# Patient Record
Sex: Male | Born: 1965 | Race: White | Hispanic: Yes | Marital: Married | Smoking: Never smoker
Health system: Southern US, Community
[De-identification: ages and names within clinical notes are randomized; demographics above are authoritative.]

## PROBLEM LIST (undated history)

## (undated) DIAGNOSIS — K219 Gastro-esophageal reflux disease without esophagitis: Secondary | ICD-10-CM

## (undated) DIAGNOSIS — A6000 Herpesviral infection of urogenital system, unspecified: Secondary | ICD-10-CM

## (undated) DIAGNOSIS — K5900 Constipation, unspecified: Secondary | ICD-10-CM

## (undated) DIAGNOSIS — K648 Other hemorrhoids: Secondary | ICD-10-CM

## (undated) DIAGNOSIS — N400 Enlarged prostate without lower urinary tract symptoms: Secondary | ICD-10-CM

## (undated) HISTORY — DX: Constipation, unspecified: K59.00

## (undated) HISTORY — DX: Herpesviral infection of urogenital system, unspecified: A60.00

## (undated) HISTORY — DX: Gastro-esophageal reflux disease without esophagitis: K21.9

## (undated) HISTORY — DX: Benign prostatic hyperplasia without lower urinary tract symptoms: N40.0

## (undated) HISTORY — DX: Other hemorrhoids: K64.8

---

## 2009-03-09 HISTORY — PX: COLONOSCOPY: SHX174

## 2017-09-26 ENCOUNTER — Encounter: Payer: Self-pay | Admitting: Gastroenterology

## 2017-10-22 ENCOUNTER — Encounter: Payer: Self-pay | Admitting: Gastroenterology

## 2017-11-05 ENCOUNTER — Encounter

## 2017-11-05 ENCOUNTER — Ambulatory Visit: Payer: Self-pay | Admitting: Gastroenterology

## 2017-11-05 ENCOUNTER — Ambulatory Visit (INDEPENDENT_AMBULATORY_CARE_PROVIDER_SITE_OTHER): Payer: PRIVATE HEALTH INSURANCE | Admitting: Gastroenterology

## 2017-11-05 ENCOUNTER — Encounter: Payer: Self-pay | Admitting: Gastroenterology

## 2017-11-05 ENCOUNTER — Other Ambulatory Visit: Payer: PRIVATE HEALTH INSURANCE

## 2017-11-05 VITALS — BP 118/88 | HR 69 | Ht 66.0 in | Wt 167.2 lb

## 2017-11-05 DIAGNOSIS — R1032 Left lower quadrant pain: Secondary | ICD-10-CM | POA: Diagnosis not present

## 2017-11-05 DIAGNOSIS — K5909 Other constipation: Secondary | ICD-10-CM | POA: Diagnosis not present

## 2017-11-05 NOTE — Progress Notes (Signed)
IMPRESSION and PLAN:    #1. LLQ pain -likely musculoskeletal rule out diverticulitis - Check CBC, CMP - Proceed with CT scan of the abdomen and pelvis with PO and IV contrast.      #2.  Chronic constipation, neg colon 02/2016 except for mild diverticulosis. BP meds amlodipine would be contributing. -Continue MiraLAX 17 g p.o. once a day for now. -Increase water intake -I have reassured the patient. -Follow-up after the above work-up is complete and if he still has problems.      HPI:    Chief Complaint:   Cody Potts is a 52 y.o. male  Patient of Cody Potts With constipation, somewhat better with MiraLAX Had change in stool caliber Has been having left lower quadrant abdominal discomfort Denies having any melena or hematochezia Has been feeling weak and tired No upper GI symptoms including heartburn regurgitation odynophagia or dysphagia. Had negative colonoscopy 03/05/2016 except for mild sigmoid diverticulosis Very much worried about cancers   Past Medical History:  Diagnosis Date  . Benign prostatic hyperplasia   . Constipation   . GERD (gastroesophageal reflux disease)   . Herpes genitalis   . Internal hemorrhoids     Current Outpatient Medications  Medication Sig Dispense Refill  . amLODipine (NORVASC) 5 MG tablet Take 5 mg by mouth daily.    . busPIRone (BUSPAR) 15 MG tablet Take 15 mg by mouth daily.    . Misc Natural Products (OSTEO BI-FLEX JOINT SHIELD) TABS Take 1 tablet by mouth 2 (two) times daily.    . OMEGA-3 KRILL OIL PO Take 350 mg by mouth daily.    Marland Kitchen. omeprazole (PRILOSEC) 40 MG capsule Take 40 mg by mouth daily.    . polyethylene glycol (MIRALAX / GLYCOLAX) packet Take 17 g by mouth daily.    . rosuvastatin (CRESTOR) 5 MG tablet Take 5 mg by mouth daily.    . tadalafil (CIALIS) 5 MG tablet Take 5 mg by mouth daily as needed for erectile dysfunction.    . Wheat Dextrin (BENEFIBER DRINK MIX PO) Take 1 capsule by mouth daily.     No current  facility-administered medications for this visit.     Past Surgical History:  Procedure Laterality Date  . COLONOSCOPY  03/09/2009   Small internal hemorrhoids.     Family History  Problem Relation Age of Onset  . Colon cancer Neg Hx   . Esophageal cancer Neg Hx   . Rectal cancer Neg Hx   . Diabetes Neg Hx     Social History   Tobacco Use  . Smoking status: Never Smoker  . Smokeless tobacco: Never Used  Substance Use Topics  . Alcohol use: Not Currently  . Drug use: Never    Not on File   Review of Systems: All systems reviewed and negative except where noted in HPI.    Physical Exam:     BP 118/88   Pulse 69   Ht 5\' 6"  (1.676 m)   Wt 167 lb 4 oz (75.9 kg)   BMI 26.99 kg/m  @WEIGHTLAST3 @ GENERAL:  Alert, oriented, cooperative, not in acute distress. PSYCH: :Pleasant, normal mood and affect. HEENT:  conjunctiva pink, mucous membranes moist, neck supple without masses. No jaundice. CARDIAC:  S1 S2 normal. No murmers. PULM: Normal respiratory effort, lungs CTA bilaterally, no wheezing. ABDOMEN: Inspection: No visible peristalsis, no abnormal pulsations, skin normal.  Palpation/percussion: Soft, nontender, nondistended, no rigidity, no abnormal dullness to percussion, no hepatosplenomegaly and no palpable abdominal  masses.  Auscultation: Normal bowel sounds, no abdominal bruits. Rectal exam: Deferred SKIN:  turgor, no lesions seen. Musculoskeletal:  Normal muscle tone, normal strength. NEURO: Alert and oriented x 3, no focal neurologic deficits.    Cody Ines,MD 11/05/2017, 4:52 PM   CC Cody Waynetta SandyMoon

## 2017-11-05 NOTE — Patient Instructions (Addendum)
If you are age 53 or older, your body mass index should be between 23-30. Your Body mass index is 26.99 kg/m. If this is out of the aforementioned range listed, please consider follow up with your Primary Care Provider.  If you are age 56 or younger, your body mass index should be between 19-25. Your Body mass index is 26.99 kg/m. If this is out of the aformentioned range listed, please consider follow up with your Primary Care Provider.   We will call you tomorrow with the date and time of your scheduled CT.  You should arrive 15 minutes prior to your appointment time for registration. Please follow the written instructions below on the day of your exam:  WARNING: IF YOU ARE ALLERGIC TO IODINE/X-RAY DYE, PLEASE NOTIFY RADIOLOGY IMMEDIATELY AT [phone]! YOU WILL BE GIVEN A 13 HOUR PREMEDICATION PREP.  1) Do not eat or drink anything(4 hours prior to your test) 2) You have been given 2 bottles of oral contrast to drink. The solution may taste better if refrigerated, but do NOT add ice or any other liquid to this solution. Shake well before drinking.    Drink 1 bottle of contrast (2 hours prior to your exam)  Drink 1 bottle of contrast (1 hour prior to your exam)  You may take any medications as prescribed with a small amount of water except for the following: Metformin, Glucophage, Glucovance, Avandamet, Riomet, Fortamet, Actoplus Met, Janumet, Glumetza or Metaglip. The above medications must be held the day of the exam AND 48 hours after the exam.  The purpose of you drinking the oral contrast is to aid in the visualization of your intestinal tract. The contrast solution may cause some diarrhea. Before your exam is started, you will be given a small amount of fluid to drink. Depending on your individual set of symptoms, you may also receive an intravenous injection of x-ray contrast/dye. Plan on being at [facility] for 30 minutes or longer, depending on the type of exam you are having  performed.  This test typically takes 30-45 minutes to complete.  ______________________________________________________________________    Please go to the lab on the 2nd floor [address] before you leave the office today.   Thank you,  Dr. Jackquline Denmark

## 2017-11-14 ENCOUNTER — Ambulatory Visit (HOSPITAL_BASED_OUTPATIENT_CLINIC_OR_DEPARTMENT_OTHER)
Admission: RE | Admit: 2017-11-14 | Discharge: 2017-11-14 | Disposition: A | Discharge status: Home / Self Care | Payer: PRIVATE HEALTH INSURANCE | Source: Ambulatory Visit | Attending: Gastroenterology | Admitting: Gastroenterology

## 2017-11-14 DIAGNOSIS — K5909 Other constipation: Secondary | ICD-10-CM | POA: Insufficient documentation

## 2017-11-14 DIAGNOSIS — R1032 Left lower quadrant pain: Secondary | ICD-10-CM | POA: Insufficient documentation

## 2017-11-14 MED ORDER — IOPAMIDOL (ISOVUE-300) INJECTION 61%
100.0000 mL | Freq: Once | INTRAVENOUS | Status: AC | PRN
  Administered 2017-11-14: 100 mL via INTRAVENOUS

## 2017-12-13 ENCOUNTER — Ambulatory Visit: Payer: PRIVATE HEALTH INSURANCE | Admitting: Gastroenterology

## 2017-12-13 ENCOUNTER — Encounter: Payer: Self-pay | Admitting: Gastroenterology

## 2017-12-13 VITALS — BP 132/88 | HR 81 | Ht 66.0 in | Wt 169.2 lb

## 2017-12-13 DIAGNOSIS — K5909 Other constipation: Secondary | ICD-10-CM | POA: Diagnosis not present

## 2017-12-13 NOTE — Progress Notes (Signed)
Chief Complaint:   Referring Provider:  Hurshel Party, NP      ASSESSMENT AND PLAN;   #1. Chronic constipation (neg CT 10/2017, neg colon 02/2016 except for mild diverticulosis) BP meds like amlodipine may be contributing.  #2. LLQ pain (resolved)  Plan: - Continue probiotics. - Can try magnesium supplements. - Increase water intake. - CT results given to the patient. CT films were also shown to the patient. - Use MiraLAX on as-needed basis. - FU in 6 months, earlier if any problems.  HPI:    Cody Potts is a 52 y.o. male  Overall has done very well Abdominal pain has resolved. Constipation is better He took MiraLAX which caused him to have diarrhea.  Hence he has stopped taking MiraLAX. He started on probiotics and has been having good bowel movements. No nausea, vomiting, abdominal bloating. Does complain about his knees which hurt.  Attributed to ? osteoarthritis   Past Medical History:  Diagnosis Date  . Benign prostatic hyperplasia   . Constipation   . GERD (gastroesophageal reflux disease)   . Herpes genitalis   . Internal hemorrhoids     Past Surgical History:  Procedure Laterality Date  . COLONOSCOPY  03/09/2009   Small internal hemorrhoids.     Family History  Problem Relation Age of Onset  . Colon cancer Neg Hx   . Esophageal cancer Neg Hx   . Rectal cancer Neg Hx   . Diabetes Neg Hx     Social History   Tobacco Use  . Smoking status: Never Smoker  . Smokeless tobacco: Never Used  Substance Use Topics  . Alcohol use: Not Currently  . Drug use: Never    Current Outpatient Medications  Medication Sig Dispense Refill  . amLODipine (NORVASC) 5 MG tablet Take 5 mg by mouth daily.    . busPIRone (BUSPAR) 15 MG tablet Take 15 mg by mouth daily.    . Misc Natural Products (OSTEO BI-FLEX JOINT SHIELD) TABS Take 1 tablet by mouth 2 (two) times daily.    . OMEGA-3 KRILL OIL PO Take 350 mg by mouth daily.    Marland Kitchen omeprazole (PRILOSEC) 40 MG  capsule Take 40 mg by mouth daily.    . polyethylene glycol (MIRALAX / GLYCOLAX) packet Take 17 g by mouth daily.    . Probiotic Product (PROBIOTIC PO) Take by mouth daily.    . rosuvastatin (CRESTOR) 5 MG tablet Take 5 mg by mouth daily.    . tadalafil (CIALIS) 5 MG tablet Take 5 mg by mouth daily as needed for erectile dysfunction.    . Wheat Dextrin (BENEFIBER DRINK MIX PO) Take 1 capsule by mouth daily.     No current facility-administered medications for this visit.     Not on File  Review of Systems:  Negative except for HPI     Physical Exam:    BP 132/88   Pulse 81   Ht 5\' 6"  (1.676 m)   Wt 169 lb 4 oz (76.8 kg)   BMI 27.32 kg/m  Filed Weights   12/13/17 1612  Weight: 169 lb 4 oz (76.8 kg)   Constitutional:  Well-developed, in no acute distress. Psychiatric: Normal mood and affect. Behavior is normal. Pulmonary/chest: Effort normal and breath sounds normal. No wheezing, rales or rhonchi. Abdominal: Soft, nondistended. Nontender. Bowel sounds active throughout. There are no masses palpable. No hepatomegaly.   Radiology Studies: Ct Abdomen Pelvis W Contrast  Result Date: 11/14/2017 CLINICAL DATA:  Left-sided abdominal  pain and constipation for several months. EXAM: CT ABDOMEN AND PELVIS WITH CONTRAST TECHNIQUE: Multidetector CT imaging of the abdomen and pelvis was performed using the standard protocol following bolus administration of intravenous contrast. CONTRAST:  ISOVUE-300 IOPAMIDOL (ISOVUE-300) INJECTION 61% COMPARISON:  Noncontrast CT from [facility] on 07/20/2017 FINDINGS: Lower Chest: No acute findings. Hepatobiliary: No hepatic masses identified. Gallbladder is unremarkable. Pancreas:  No mass or inflammatory changes. Spleen: Within normal limits in size and appearance. Adrenals/Urinary Tract: No masses identified. No evidence of hydronephrosis. Stomach/Bowel: No evidence of obstruction, inflammatory process or abnormal fluid collections. Normal  appendix visualized. Vascular/Lymphatic: No pathologically enlarged lymph nodes. No abdominal aortic aneurysm. Minimal aortic atherosclerotic calcification. Reproductive:  No mass or other significant abnormality. Other:  None. Musculoskeletal:  No suspicious bone lesions identified. IMPRESSION: Negative.  No acute findings or other significant abnormality. Electronically Signed   By: Myles Rosenthal M.D.   On: 11/14/2017 16:34  CT scanning films shown to the patient I spent 15 minutes of face-to-face time with the patient. Greater than 50% of the time was spent counseling and coordinating care.    Edman Circle, MD 12/13/2017, 4:22 PM  Cc: Hurshel Party, NP

## 2017-12-13 NOTE — Patient Instructions (Signed)
If you are age 53 or older, your body mass index should be between 23-30. Your Body mass index is 27.32 kg/m. If this is out of the aforementioned range listed, please consider follow up with your Primary Care Provider.  If you are age 47 or younger, your body mass index should be between 19-25. Your Body mass index is 27.32 kg/m. If this is out of the aformentioned range listed, please consider follow up with your Primary Care Provider.   Thank you,  Dr. Lynann Bologna

## 2018-04-04 DIAGNOSIS — I10 Essential (primary) hypertension: Secondary | ICD-10-CM | POA: Diagnosis not present

## 2018-04-04 DIAGNOSIS — R600 Localized edema: Secondary | ICD-10-CM | POA: Diagnosis not present

## 2018-04-04 DIAGNOSIS — Z6827 Body mass index (BMI) 27.0-27.9, adult: Secondary | ICD-10-CM | POA: Diagnosis not present

## 2018-05-05 DIAGNOSIS — I10 Essential (primary) hypertension: Secondary | ICD-10-CM | POA: Diagnosis not present

## 2018-05-05 DIAGNOSIS — E7800 Pure hypercholesterolemia, unspecified: Secondary | ICD-10-CM | POA: Diagnosis not present

## 2018-06-16 DIAGNOSIS — Z6827 Body mass index (BMI) 27.0-27.9, adult: Secondary | ICD-10-CM | POA: Diagnosis not present

## 2018-06-16 DIAGNOSIS — J302 Other seasonal allergic rhinitis: Secondary | ICD-10-CM | POA: Diagnosis not present

## 2018-06-16 DIAGNOSIS — J029 Acute pharyngitis, unspecified: Secondary | ICD-10-CM | POA: Diagnosis not present

## 2018-06-26 DIAGNOSIS — Z6827 Body mass index (BMI) 27.0-27.9, adult: Secondary | ICD-10-CM | POA: Diagnosis not present

## 2018-06-26 DIAGNOSIS — R0789 Other chest pain: Secondary | ICD-10-CM | POA: Diagnosis not present

## 2019-01-05 IMAGING — CT CT ABD-PELV W/ CM
2 of 5 series · 16 of 46 positions shown, 18 images · IV contrast (APPLIED)
Comparison: Noncontrast CT from [facility] on 07/20/2017

CLINICAL DATA: Left-sided abdominal pain and constipation for
several months.

EXAM:
CT ABDOMEN AND PELVIS WITH CONTRAST
TECHNIQUE: Multidetector CT imaging of the abdomen and pelvis was performed
using the standard protocol following bolus administration of
intravenous contrast.
CONTRAST:  100mL [id number]-[id number] IOPAMIDOL (R66G74-AUU) INJECTION 61%

[Series 2: axial st · axial · 0.82mm/px · z∈[-514,-89]mm · 13 of 97 slices shown, 15 images]
[im 6/97  soft-tissue]
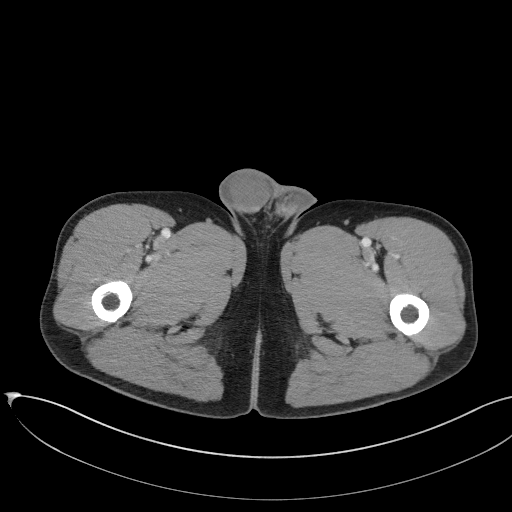
[im 6/97  bone]
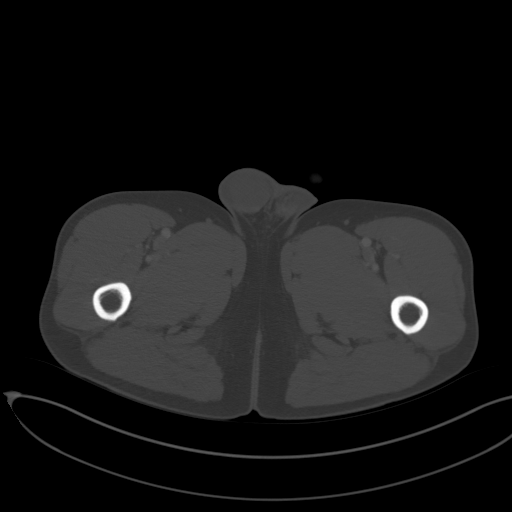
[im 16/97  soft-tissue]
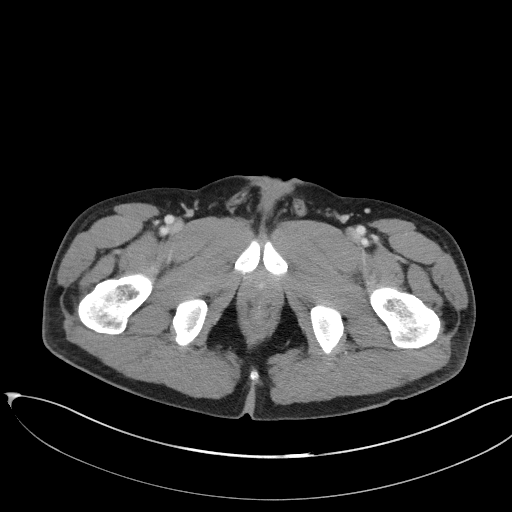
[im 21/97  soft-tissue]
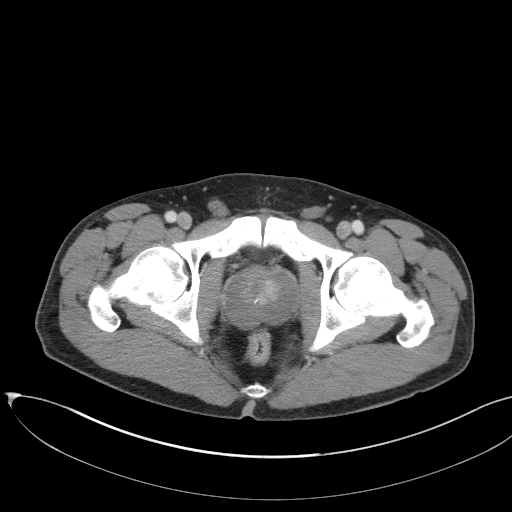
[im 26/97  soft-tissue]
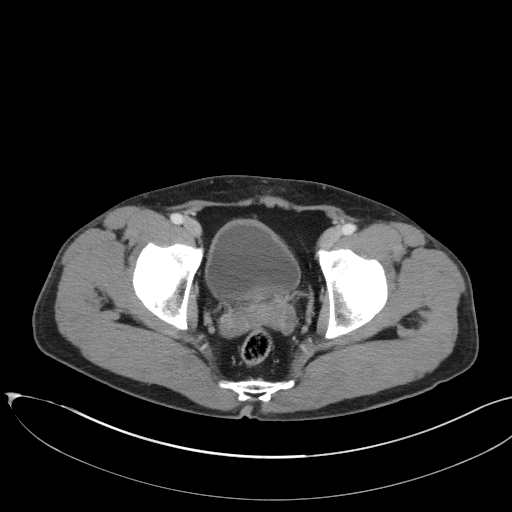
[im 36/97  soft-tissue]
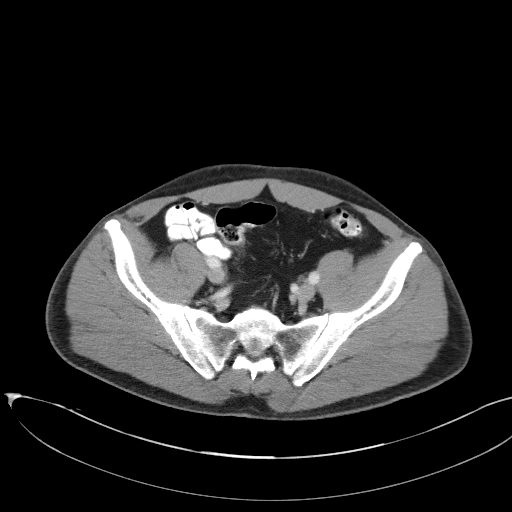
[im 41/97  soft-tissue]
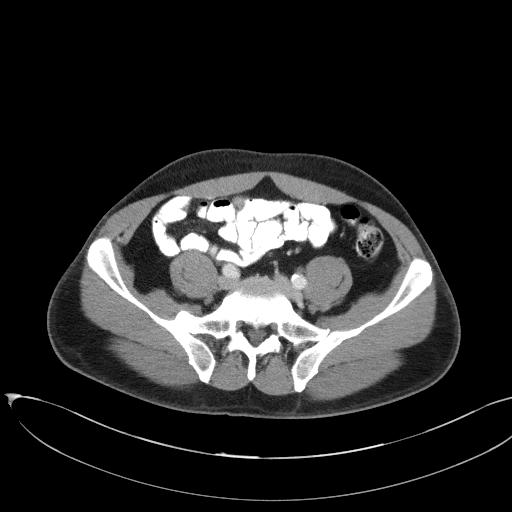
[im 51/97  soft-tissue]
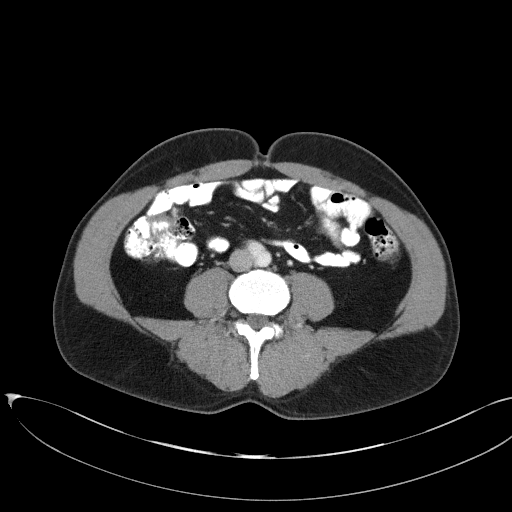
[im 56/97  soft-tissue]
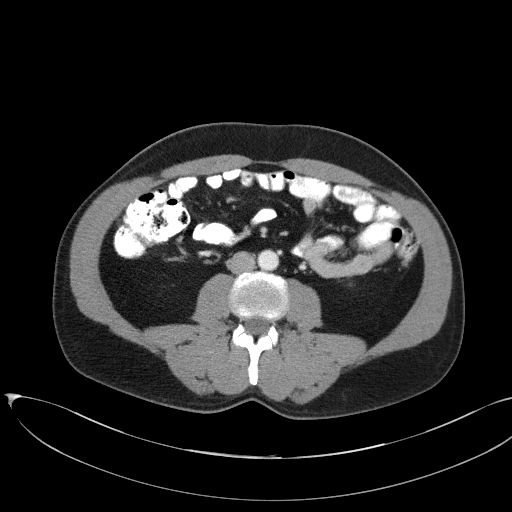
[im 61/97  soft-tissue]
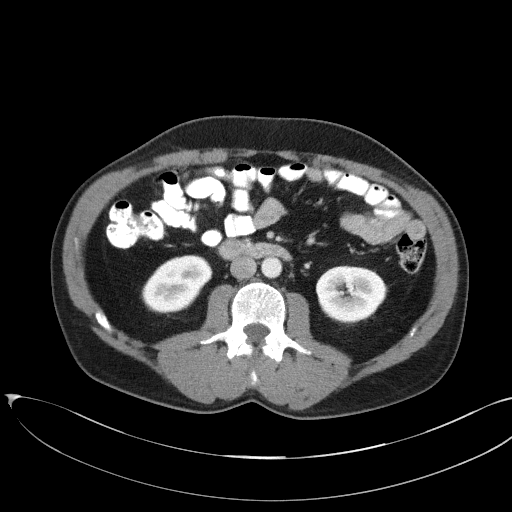
[im 61/97  bone]
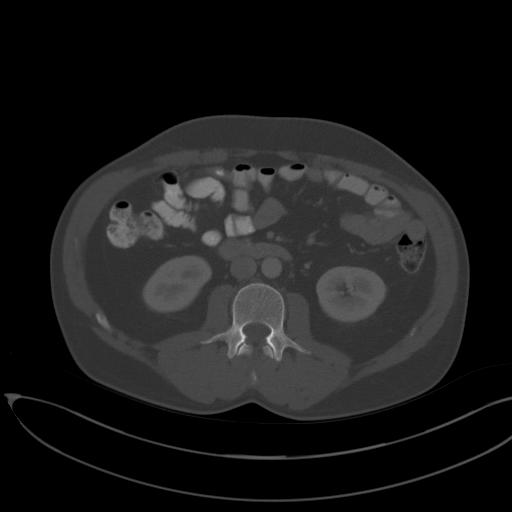
[im 71/97  soft-tissue]
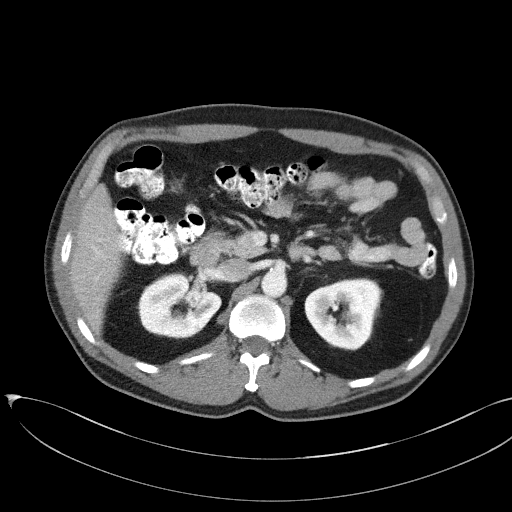
[im 76/97  soft-tissue]
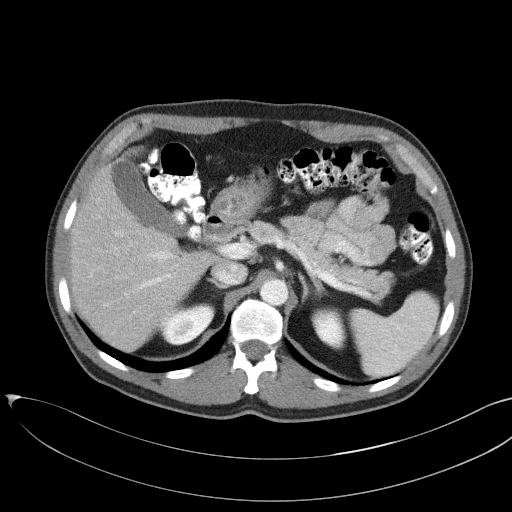
[im 81/97  soft-tissue]
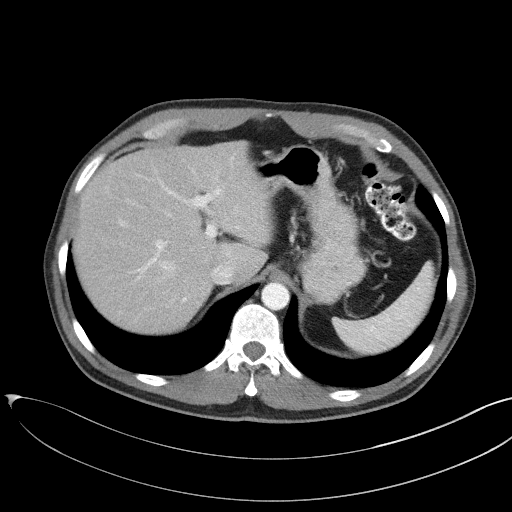
[im 91/97  soft-tissue]
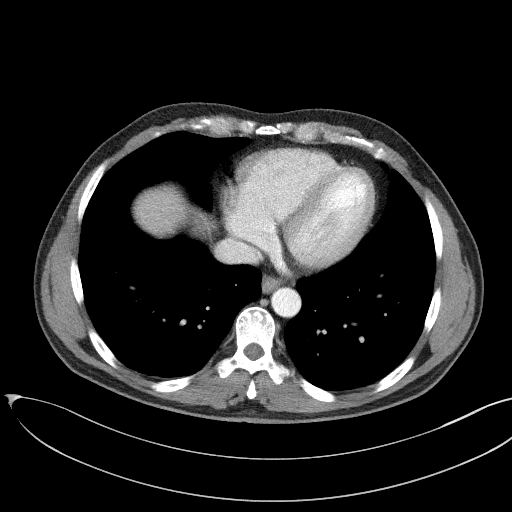

[Series 5: coronal st · coronal · 0.73mm/px · 3 of 88 slices shown]
[im 30/88  soft-tissue]
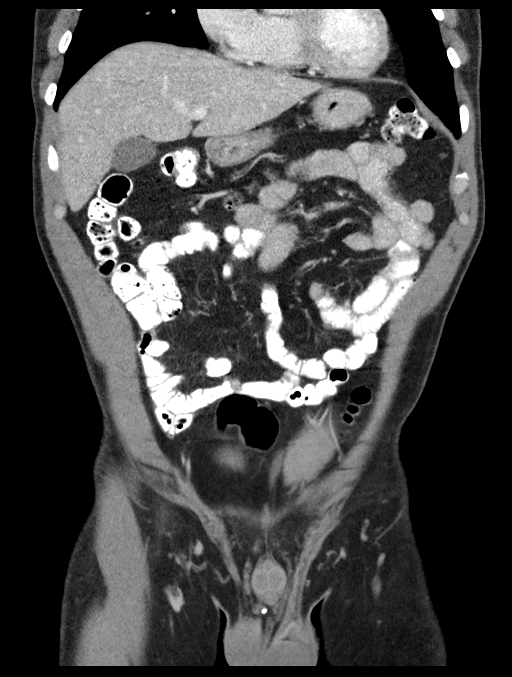
[im 39/88  soft-tissue]
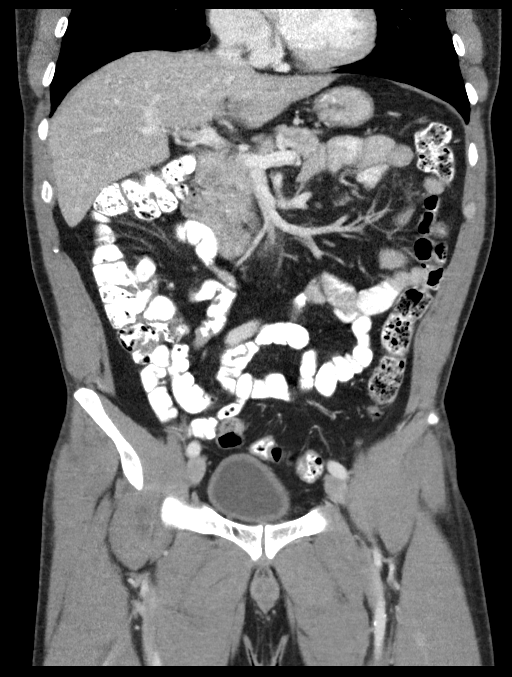
[im 49/88  soft-tissue]
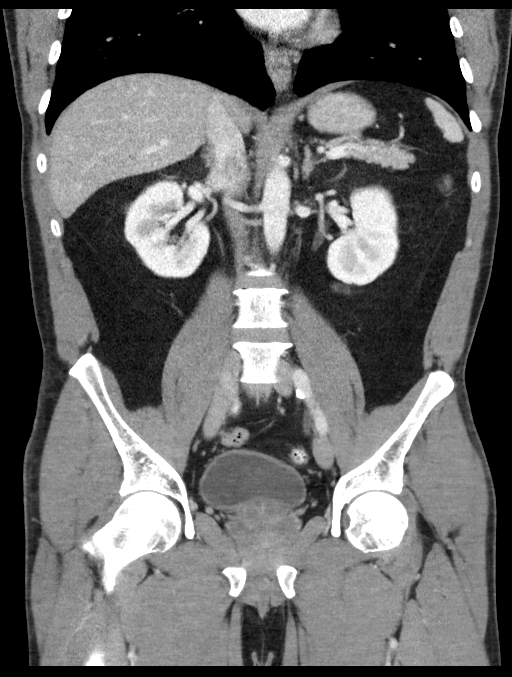

[16 of 46 positions shown; findings below may reference images not displayed]

FINDINGS: Lower Chest: No acute findings.

Hepatobiliary: No hepatic masses identified. Gallbladder is
unremarkable.

Pancreas:  No mass or inflammatory changes.

Spleen: Within normal limits in size and appearance.

Adrenals/Urinary Tract: No masses identified. No evidence of
hydronephrosis.

Stomach/Bowel: No evidence of obstruction, inflammatory process or
abnormal fluid collections. Normal appendix visualized.

Vascular/Lymphatic: No pathologically enlarged lymph nodes. No
abdominal aortic aneurysm. Minimal aortic atherosclerotic
calcification.

Reproductive:  No mass or other significant abnormality.

Other:  None.

Musculoskeletal:  No suspicious bone lesions identified.
IMPRESSION: Negative.  No acute findings or other significant abnormality.

## 2024-05-13 ENCOUNTER — Institutional Professional Consult (permissible substitution) (INDEPENDENT_AMBULATORY_CARE_PROVIDER_SITE_OTHER): Payer: PRIVATE HEALTH INSURANCE | Admitting: Otolaryngology
